# Patient Record
Sex: Male | Born: 1965
Health system: Southern US, Community
[De-identification: ages and names within clinical notes are randomized; demographics above are authoritative.]

## PROBLEM LIST (undated history)

## (undated) DIAGNOSIS — E119 Type 2 diabetes mellitus without complications: Secondary | ICD-10-CM

## (undated) HISTORY — DX: Type 2 diabetes mellitus without complications: E11.9

---

## 2003-12-19 ENCOUNTER — Observation Stay (HOSPITAL_COMMUNITY): Admission: RE | Admit: 2003-12-19 | Discharge: 2003-12-20 | Payer: Self-pay | Admitting: Orthopedic Surgery

## 2007-03-13 ENCOUNTER — Emergency Department (HOSPITAL_COMMUNITY): Admission: EM | Admit: 2007-03-13 | Discharge: 2007-03-13 | Payer: Self-pay | Admitting: Family Medicine

## 2008-10-18 ENCOUNTER — Encounter: Admission: RE | Admit: 2008-10-18 | Discharge: 2008-10-18 | Payer: Self-pay | Admitting: Family Medicine

## 2010-09-05 NOTE — Op Note (Signed)
NAME:  Juan Compton, Juan Compton                           ACCOUNT NO.:  [id number]   MEDICAL RECORD NO.:  [id number]                   PATIENT TYPE:  AMB   LOCATION:  DAY                                  FACILITY:  [facility]   PHYSICIAN:  Ronald A. Gioffre, M.D.             DATE OF BIRTH:  [birth year]   DATE OF PROCEDURE:  12/19/2003  DATE OF DISCHARGE:                                 OPERATIVE REPORT   PREOPERATIVE DIAGNOSES:  Complete rupture of the left Achilles tendon.   POSTOPERATIVE DIAGNOSIS:  Complete rupture of the left Achilles tendon.   OPERATION:  1.  Repair of the Achilles tendon, left ankle.  2.  Reinforcement with a Tissue Mend tendon graft with a 5 x 6 double      thickness.  3.  Application of a short-leg cast.   SURGEON:  Georges Lynch. Darrelyn Hillock, M.D.   ASSISTANT:  Ebbie Ridge. Paitsel, P.A.   PROCEDURE:  Under general anesthesia, with the patient in a prone position,  a routine orthopedic prep and draping of the left lower extremity was  carried out.  Following that, the patient first had 1 g of IV Ancef.  The  incision was made along the lateral border of the Achilles tendon.  Great  care was taken not to injure any of the underlying neurological and vascular  structures.  I identified the Achilles tendon.  The tendon looked like the  end of a mop the way it was torn.  I irrigated the area out and sutured the  tendon end-to-end, but we still had the defect that we approximated with a  double-thickness Tissue Mend tendon graft.  Following the subcu was closed  with 0 Vicryl, the skin with metal staples.  The patient's foot was placed  in plantar flexion and he was casted with a short-leg cast.  Note, with this  foot plantar flexed and me flexing and extending his knee, there was no  tension on his graft site or his tendon site.  He will be placed on Coumadin  and heparin protocol and released tomorrow after he learns to walk with his  crutches.            Ronald A. Darrelyn Hillock, M.D.    RAG/MEDQ  D:  12/19/2003  T:  12/19/2003  Job:  [id number]

## 2011-09-15 ENCOUNTER — Emergency Department (HOSPITAL_COMMUNITY)
Admission: EM | Admit: 2011-09-15 | Discharge: 2011-09-15 | Payer: 59 | Source: Home / Self Care | Attending: Family Medicine | Admitting: Family Medicine

## 2011-09-15 NOTE — ED Notes (Signed)
Pt called in lobby x2.  

## 2011-09-15 NOTE — ED Notes (Signed)
Pt called x 3 no answer 

## 2011-09-15 NOTE — ED Notes (Signed)
Pt called in lobby x 1 

## 2013-02-03 ENCOUNTER — Telehealth: Payer: Self-pay | Admitting: *Deleted

## 2013-02-03 NOTE — Telephone Encounter (Signed)
rx denied, patient needs office visit

## 2018-10-18 ENCOUNTER — Encounter: Payer: Self-pay | Admitting: Podiatry

## 2018-10-18 ENCOUNTER — Other Ambulatory Visit: Payer: Self-pay

## 2018-10-18 ENCOUNTER — Ambulatory Visit (INDEPENDENT_AMBULATORY_CARE_PROVIDER_SITE_OTHER): Payer: 59

## 2018-10-18 ENCOUNTER — Ambulatory Visit (INDEPENDENT_AMBULATORY_CARE_PROVIDER_SITE_OTHER): Payer: 59 | Admitting: Podiatry

## 2018-10-18 VITALS — BP 137/84 | HR 83 | Temp 98.3°F | Resp 16

## 2018-10-18 DIAGNOSIS — M779 Enthesopathy, unspecified: Secondary | ICD-10-CM | POA: Diagnosis not present

## 2018-10-18 DIAGNOSIS — M778 Other enthesopathies, not elsewhere classified: Secondary | ICD-10-CM

## 2018-10-18 DIAGNOSIS — S92811A Other fracture of right foot, initial encounter for closed fracture: Secondary | ICD-10-CM | POA: Diagnosis not present

## 2018-10-18 DIAGNOSIS — L603 Nail dystrophy: Secondary | ICD-10-CM

## 2018-10-18 NOTE — Progress Notes (Signed)
  Subjective:  Patient ID: Juan Compton, male    DOB: 07/20/1965,  MRN: [id number] HPI Chief Complaint  Patient presents with  . Foot Pain    Sub 1st MPJ right - aching, sudden pain that started Saturday, no injury, iced and advil  . Nail Problem    Toenails - thick and discolored x years  . New Patient (Initial Visit)    53 y.o. male presents with the above complaint.   ROS: Denies fever chills nausea vomiting muscle aches pains calf pain back pain chest pain shortness of breath.  No past medical history on file.   Current Outpatient Medications:  .  GLIPIZIDE PO, Take by mouth., Disp: , Rfl:  .  metFORMIN (GLUCOPHAGE) 500 MG tablet, Take by mouth 2 (two) times daily with a meal., Disp: , Rfl:  .  Insulin Glargine (BASAGLAR KWIKPEN) 100 UNIT/ML SOPN, INJECT 50 UNITS SUBCUTANEOUSLY ONCE DAILY, Disp: , Rfl:  .  MM PEN NEEDLES 31G X 8 MM MISC, AS DIRECTED ONCE DAILY, Disp: , Rfl:  .  sildenafil (REVATIO) 20 MG tablet, TAKE 1 TO 5 TABLETS BY MOUTH ONCE DAILY AS NEEDED FOR 18 DAYS, Disp: , Rfl:  .  simvastatin (ZOCOR) 40 MG tablet, TAKE 1 TABLET BY MOUTH EVERY OTHER DAY IN THE EVENING, Disp: , Rfl:   No Known Allergies Review of Systems Objective:   Vitals:   10/18/18 1552  BP: 137/84  Pulse: 83  Resp: 16  Temp: 98.3 F (36.8 C)    General: Well developed, nourished, in no acute distress, alert and oriented x3   Dermatological: Skin is warm, dry and supple bilateral. Nails x 10 are well maintained; remaining integument appears unremarkable at this time. There are no open sores, no preulcerative lesions, no rash or signs of infection present.  Hallux toenails are thick yellow dystrophic possibly mycotic.  Vascular: Dorsalis Pedis artery and Posterior Tibial artery pedal pulses are 2/4 bilateral with immedate capillary fill time. Pedal hair growth present. No varicosities and no lower extremity edema present bilateral.   Neruologic: Grossly intact via light touch bilateral.  Vibratory intact via tuning fork bilateral. Protective threshold with Semmes Wienstein monofilament intact to all pedal sites bilateral. Patellar and Achilles deep tendon reflexes 2+ bilateral. No Babinski or clonus noted bilateral.   Musculoskeletal: No gross boney pedal deformities bilateral. No pain, crepitus, or limitation noted with foot and ankle range of motion bilateral. Muscular strength 5/5 in all groups tested bilateral.  Pain on palpation and attempted range of motion of the first metatarsophalangeal joint and particularly the tibial sesamoid.  Gait: Unassisted, Nonantalgic.    Radiographs:  Radiographs taken today demonstrates what appears to be a fracture of the tibial sesamoid and some osteoarthritic changes of the first metatarsal phalangeal joint.  Assessment & Plan:   Assessment: Osteoarthritis of the first metatarsophalangeal joint fracture tibial sesamoid right.  Possible onychomycosis.  Plan: Placed him in a Darco shoe and meloxicam today I also took samples of the toenails to be sent for pathologic evaluation we will follow-up with him in 1 month     Max T. [place], [place]

## 2018-10-19 ENCOUNTER — Ambulatory Visit: Payer: 59 | Admitting: Podiatry

## 2018-10-19 ENCOUNTER — Telehealth: Payer: Self-pay | Admitting: Podiatry

## 2018-10-19 NOTE — Addendum Note (Signed)
Addended by: Clovis Riley E on: 10/19/2018 12:07 PM   Modules accepted: Orders

## 2018-10-19 NOTE — Telephone Encounter (Signed)
Patient says his medications was not called into the pharmacy from yesterdays appt. An anti inflammatory medication. Also he has a questions about a discontinues medication.

## 2018-10-20 MED ORDER — MELOXICAM 15 MG PO TABS: 15.0000 mg | ORAL_TABLET | Freq: Every day | ORAL | 3 refills | Status: AC

## 2018-10-20 NOTE — Addendum Note (Signed)
Addended by: Rip Harbour on: 10/20/2018 08:24 AM   Modules accepted: Orders

## 2018-10-25 ENCOUNTER — Ambulatory Visit: Payer: 59 | Admitting: Sports Medicine

## 2018-11-17 ENCOUNTER — Ambulatory Visit (INDEPENDENT_AMBULATORY_CARE_PROVIDER_SITE_OTHER): Payer: 59

## 2018-11-17 ENCOUNTER — Encounter: Payer: Self-pay | Admitting: Podiatry

## 2018-11-17 ENCOUNTER — Ambulatory Visit (INDEPENDENT_AMBULATORY_CARE_PROVIDER_SITE_OTHER): Payer: 59 | Admitting: Podiatry

## 2018-11-17 ENCOUNTER — Other Ambulatory Visit: Payer: Self-pay

## 2018-11-17 VITALS — Temp 97.4°F

## 2018-11-17 DIAGNOSIS — S92811D Other fracture of right foot, subsequent encounter for fracture with routine healing: Secondary | ICD-10-CM | POA: Diagnosis not present

## 2018-11-17 NOTE — Progress Notes (Signed)
He presents today for follow-up of his fracture to the tibial sesamoid right foot.  States that is doing much better I am also here for my lab work.  Objective: Vital signs are stable he is alert and oriented x3.  No longer has pain on palpation and range of motion of the tibial sesamoid of the right foot.  Radiographs confirm that appears to be healing fracture.  Pathology report does demonstrate onychomycosis.  Assessment: Onychomycosis well-healing tibial sesamoid.  Plan: Discussed with him in great detail today options regarding the oral medication laser therapy and topical therapy he does not want to do any therapy for the onychomycosis at this point.  And he will follow-up with [place] for the tibial sesamoid whenever he needs [place].

## 2018-12-16 ENCOUNTER — Ambulatory Visit
Admission: RE | Admit: 2018-12-16 | Discharge: 2018-12-16 | Disposition: A | Discharge status: Home / Self Care | Payer: 59 | Source: Ambulatory Visit | Attending: Internal Medicine | Admitting: Internal Medicine

## 2018-12-16 ENCOUNTER — Other Ambulatory Visit: Payer: Self-pay | Admitting: Internal Medicine

## 2018-12-16 DIAGNOSIS — R05 Cough: Secondary | ICD-10-CM

## 2018-12-16 DIAGNOSIS — R053 Chronic cough: Secondary | ICD-10-CM

## 2019-01-16 ENCOUNTER — Other Ambulatory Visit: Payer: Self-pay

## 2019-01-16 ENCOUNTER — Institutional Professional Consult (permissible substitution): Payer: Self-pay | Admitting: Pulmonary Disease

## 2019-01-16 ENCOUNTER — Ambulatory Visit (INDEPENDENT_AMBULATORY_CARE_PROVIDER_SITE_OTHER): Payer: 59 | Admitting: Pulmonary Disease

## 2019-01-16 ENCOUNTER — Encounter: Payer: Self-pay | Admitting: Pulmonary Disease

## 2019-01-16 VITALS — BP 122/76 | HR 96 | Temp 97.4°F | Ht 71.0 in | Wt 267.4 lb

## 2019-01-16 DIAGNOSIS — R053 Chronic cough: Secondary | ICD-10-CM

## 2019-01-16 DIAGNOSIS — R05 Cough: Secondary | ICD-10-CM | POA: Diagnosis not present

## 2019-01-16 DIAGNOSIS — E119 Type 2 diabetes mellitus without complications: Secondary | ICD-10-CM | POA: Insufficient documentation

## 2019-01-16 DIAGNOSIS — E785 Hyperlipidemia, unspecified: Secondary | ICD-10-CM | POA: Insufficient documentation

## 2019-01-16 NOTE — Progress Notes (Signed)
Subjective:   PATIENT ID: Juan Compton GENDER: male DOB: [birth year], MRN: [id number]   HPI  Chief Complaint  Patient presents with  . Consult    persistant cough possible acid reflux-1 year    Reason for Visit: New consult for chronic cough  Juan Compton is a 53 year old male never smoker with DM2 and HTN who presents with chronic cough x 1 year  Seen by Deboraha Sprang on 12/16/18. PCP note reviewed and summarized as follows: Seen for chronic cough since October 2019 Denies allergies. Associated with hoarsness. Previously seen by ENT in 2015 with negative evaluation. Tried on allery allergy and reflux medication and referred to Pulmonary. CXR obtained and reported normal.  He reports a chronic cough for over a year since October 2019. He noticed the cough occurring at night that would wake him up. He describes the cough as unproductive. Denies wheezing. Very rarely he will have shortness of breath. 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 He has hoarseness that will come and go with talking. He reports mild improvement with allergies and reflux. He had intermittent reflux in the past but nothing persistent. No allergy symptoms but tolerating his generic allegra.  I have personally reviewed patient's past medical/family/social history, allergies, current medications.  Past Medical History:  Diagnosis Date  . Diabetes (HCC)      Family History  Problem Relation Age of Onset  . Breast cancer Sister      Social History   Occupational History  . Not on file  Tobacco Use  . Smoking status: Never Smoker  . Smokeless tobacco: Never Used  Substance and Sexual Activity  . Alcohol use: Not Currently    Frequency: Never  . Drug use: Not on file  . Sexual activity: Not on file    Allergies  Allergen Reactions  . Pecan Nut (Diagnostic) Anaphylaxis    All Tree nuts     Outpatient Medications Prior to Visit  Medication Sig  Dispense Refill  . GLIPIZIDE PO Take 5 mg by mouth daily.     . Insulin Glargine (BASAGLAR KWIKPEN) 100 UNIT/ML SOPN INJECT 50 UNITS SUBCUTANEOUSLY ONCE DAILY    . metFORMIN (GLUCOPHAGE-XR) 500 MG 24 hr tablet     . MM PEN NEEDLES 31G X 8 MM MISC AS DIRECTED ONCE DAILY    . sildenafil (REVATIO) 20 MG tablet TAKE 1 TO 5 TABLETS BY MOUTH ONCE DAILY AS NEEDED FOR 18 DAYS    . meloxicam (MOBIC) 15 MG tablet Take 1 tablet (15 mg total) by mouth daily. (Patient not taking: Reported on 01/16/2019) 30 tablet 3  . simvastatin (ZOCOR) 40 MG tablet TAKE 1 TABLET BY MOUTH EVERY OTHER DAY IN THE EVENING     No facility-administered medications prior to visit.     Review of Systems  Constitutional: Negative for chills, diaphoresis, fever, malaise/fatigue and weight loss.  HENT: Negative for congestion, ear pain and sore throat.   Respiratory: Positive for cough. Negative for hemoptysis, sputum production, shortness of breath and wheezing.   Cardiovascular: Negative for chest pain, palpitations and leg swelling.  Gastrointestinal: Negative for abdominal pain, heartburn and nausea.  Genitourinary: Negative for frequency.  Musculoskeletal: Negative for joint pain and myalgias.  Skin: Negative for itching and rash.  Neurological: Negative for dizziness, weakness and headaches.  Endo/Heme/Allergies: Does not bruise/bleed easily.  Psychiatric/Behavioral: Negative for depression. The patient is not nervous/anxious.  Objective:   Vitals:   01/16/19 1124 01/16/19 1126  BP:  122/76  Pulse:  96  Temp: (!) 97.4 F (36.3 C)   TempSrc: Temporal   SpO2:  98%  Weight: 267 lb 6.4 oz (121.3 kg)   Height: 5\' 11"  (1.803 m)    SpO2: 98 % O2 Device: None (Room air)  Physical Exam: General: Well-appearing, no acute distress HENT: [place], AT Eyes: EOMI, no scleral icterus Respiratory: Clear to auscultation bilaterally.  No crackles, wheezing or rales Cardiovascular: RRR, -M/R/G, no JVD GI: BS+, soft,  nontender Extremities:-Edema,-tenderness Neuro: AAO x4, CNII-XII grossly intact Skin: Intact, no rashes or bruising Psych: Normal mood, normal affect  Data Reviewed:  Imaging: CXR 12/16/18 (report only) - Lungs are clear. Heart size and pulmonary vascularity are normal. No adenopathy. No bone lesions.  PFT: None on file   Labs: [facility] 12/16/18 [place] Na/K/Cl/CO2/BUN/Cr 138/4.5/104/26/17/1.18  Imaging, labs and tests noted above have been reviewed independently by me.    Assessment & Plan:   Discussion: 53 year old male never smoker with DM2 and HTN who presents with chronic cough x 1 year. Mild improvement on allergy and reflux medications. Medications reviewed and no ACEI. Will rule out underlying lung issues that may contribute to cough. Discussed voice rest however this is difficult for his profession.  Chronic Cough We will order Pulmonary Function Test prior to your next visit  Please continue to take your reflux and allergy medication as prescribed  Health Maintenance  There is no immunization history on file for this patient. Declined influenza vaccine.   Orders Placed This Encounter  Procedures  . Pulmonary function test    Standing Status:   Future    Standing Expiration Date:   01/16/2020    Order Specific Question:   Where should this test be performed?    Answer:   [facility]    Order Specific Question:   Full PFT: includes the following: basic spirometry, spirometry pre & post bronchodilator, diffusion capacity (DLCO), lung volumes    Answer:   Full PFT  No orders of the defined types were placed in this encounter.   Return after PFTs.  [place], MD [facility] 01/16/2019 4:23 PM  Office Number [phone]

## 2019-01-16 NOTE — Patient Instructions (Signed)
Chronic Cough We will order Pulmonary Function Test prior to your next visit  Please continue to take your reflux and allergy medication as prescribed  For any issues or questions, feel free to contact our office  F/u with me after PFTs

## 2019-01-16 NOTE — Progress Notes (Deleted)
    Subjective:   PATIENT ID: Juan Compton GENDER: male DOB: Oct 27, 1965, MRN: [id number]   HPI  No chief complaint on file.   Reason for Visit: New consult for chronic cough  Referred to Pulmonary clinic by Elenora Gamma. Records*** *** Social History:  Environmental exposures: ***  I have personally reviewed patient's past medical/family/social history, allergies, current medications.***  No past medical history on file.   No family history on file.   Social History   Occupational History  . Not on file  Tobacco Use  . Smoking status: Never Smoker  . Smokeless tobacco: Never Used  Substance and Sexual Activity  . Alcohol use: Not Currently    Frequency: Never  . Drug use: Not on file  . Sexual activity: Not on file    No Known Allergies   Outpatient Medications Prior to Visit  Medication Sig Dispense Refill  . GLIPIZIDE PO Take by mouth.    . Insulin Glargine (BASAGLAR KWIKPEN) 100 UNIT/ML SOPN INJECT 50 UNITS SUBCUTANEOUSLY ONCE DAILY    . meloxicam (MOBIC) 15 MG tablet Take 1 tablet (15 mg total) by mouth daily. 30 tablet 3  . metFORMIN (GLUCOPHAGE-XR) 500 MG 24 hr tablet     . MM PEN NEEDLES 31G X 8 MM MISC AS DIRECTED ONCE DAILY    . sildenafil (REVATIO) 20 MG tablet TAKE 1 TO 5 TABLETS BY MOUTH ONCE DAILY AS NEEDED FOR 18 DAYS    . simvastatin (ZOCOR) 40 MG tablet TAKE 1 TABLET BY MOUTH EVERY OTHER DAY IN THE EVENING     No facility-administered medications prior to visit.     ROS   Objective:  There were no vitals filed for this visit.    Physical Exam: General: Well-appearing, no acute distress HENT: [place], AT, OP clear, MMM Eyes: EOMI, no scleral icterus Lymph: no cervical lymphadenopathy Respiratory: Clear to auscultation bilaterally.  No crackles, wheezing or rales Cardiovascular: RRR, -M/R/G, no JVD GI: BS+, soft, nontender Extremities:-Edema,-tenderness Neuro: AAO x4, CNII-XII grossly intact Skin: Intact, no rashes or bruising Psych:  Normal mood, normal affect  Data Reviewed:  Imaging:  PFT:  Labs:  Imaging, labs and tests noted above have been reviewed independently by me.    Assessment & Plan:   Discussion: ***  Health Maintenance Pneumonia*** Influenza*** CT Lung Screen***  No orders of the defined types were placed in this encounter. No orders of the defined types were placed in this encounter.   No follow-ups on file.  [place], MD [facility] 01/16/2019 7:27 AM  Office Number [phone]

## 2019-01-20 ENCOUNTER — Telehealth: Payer: Self-pay | Admitting: Pulmonary Disease

## 2019-01-20 NOTE — Telephone Encounter (Signed)
Attempted to call patient to schedule covid testing, no answer, left message to call back.

## 2019-01-25 ENCOUNTER — Ambulatory Visit: Payer: 59 | Admitting: Pulmonary Disease

## 2019-01-25 ENCOUNTER — Other Ambulatory Visit: Payer: Self-pay | Admitting: Pulmonary Disease

## 2019-01-25 NOTE — Telephone Encounter (Signed)
Patient scheduled. Nothing further needed at this time.

## 2019-01-25 NOTE — Telephone Encounter (Signed)
Attempted to call patient to schedule covid testing prior to PFT, no answer, left message to call back.

## 2019-01-30 ENCOUNTER — Other Ambulatory Visit: Payer: Self-pay

## 2019-01-30 ENCOUNTER — Other Ambulatory Visit (HOSPITAL_COMMUNITY): Admission: RE | Admit: 2019-01-30 | Payer: 59 | Source: Ambulatory Visit

## 2019-01-30 DIAGNOSIS — Z20822 Contact with and (suspected) exposure to covid-19: Secondary | ICD-10-CM

## 2019-01-31 LAB — NOVEL CORONAVIRUS, NAA: SARS-CoV-2, NAA: NOT DETECTED

## 2019-02-01 ENCOUNTER — Ambulatory Visit: Payer: 59 | Admitting: Pulmonary Disease

## 2019-02-02 ENCOUNTER — Other Ambulatory Visit: Payer: Self-pay

## 2019-02-02 ENCOUNTER — Ambulatory Visit (HOSPITAL_COMMUNITY)
Admission: RE | Admit: 2019-02-02 | Discharge: 2019-02-02 | Disposition: A | Discharge status: Home / Self Care | Payer: 59 | Source: Ambulatory Visit | Attending: Pulmonary Disease | Admitting: Pulmonary Disease

## 2019-02-02 DIAGNOSIS — R053 Chronic cough: Secondary | ICD-10-CM

## 2019-02-02 DIAGNOSIS — R05 Cough: Secondary | ICD-10-CM | POA: Insufficient documentation

## 2019-02-02 LAB — PULMONARY FUNCTION TEST
DL/VA % pred: 127 %
DL/VA: 5.53 ml/min/mmHg/L
DLCO unc % pred: 92 %
DLCO unc: 28.41 ml/min/mmHg
FEF 25-75 Post: 5.36 L/sec
FEF 25-75 Pre: 4.17 L/sec
FEF2575-%Change-Post: 28 %
FEF2575-%Pred-Post: 157 %
FEF2575-%Pred-Pre: 122 %
FEV1-%Change-Post: 6 %
FEV1-%Pred-Post: 88 %
FEV1-%Pred-Pre: 82 %
FEV1-Post: 3.12 L
FEV1-Pre: 2.92 L
FEV1FVC-%Change-Post: 0 %
FEV1FVC-%Pred-Pre: 112 %
FEV6-%Change-Post: 6 %
FEV6-%Pred-Post: 80 %
FEV6-%Pred-Pre: 75 %
FEV6-Post: 3.48 L
FEV6-Pre: 3.27 L
FEV6FVC-%Change-Post: 0 %
FEV6FVC-%Pred-Post: 102 %
FEV6FVC-%Pred-Pre: 102 %
FVC-%Change-Post: 6 %
FVC-%Pred-Post: 78 %
FVC-%Pred-Pre: 73 %
FVC-Post: 3.48 L
FVC-Pre: 3.27 L
Post FEV1/FVC ratio: 90 %
Post FEV6/FVC ratio: 100 %
Pre FEV1/FVC ratio: 89 %
Pre FEV6/FVC Ratio: 100 %
RV % pred: 68 %
RV: 1.51 L
TLC % pred: 69 %
TLC: 5.15 L

## 2019-02-02 MED ORDER — ALBUTEROL SULFATE (2.5 MG/3ML) 0.083% IN NEBU
2.5000 mg | INHALATION_SOLUTION | Freq: Once | RESPIRATORY_TRACT | Status: AC
  Administered 2019-02-02: 2.5 mg via RESPIRATORY_TRACT

## 2019-02-05 NOTE — Progress Notes (Signed)
Subjective:   PATIENT ID: Juan Compton GENDER: male DOB: March 20, 1966, MRN: [id number]   HPI  Chief Complaint  Patient presents with  . Follow-up    PFT   Reason for Visit: Follow-up for chronic cough  Mr. Juan Compton is a 53 year old male never smoker with past medical history of DM2 and HTN who presents for follow-up for chronic cough x 1 year.  He reports a chronic cough for over a year since October 2019. He noticed the cough occurring at night that would wake him up. He describes the cough as unproductive. Denies wheezing. Very rarely he will have shortness of breath. 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 He has hoarseness that will come and go with talking. He reports mild improvement with allergies and reflux. He had intermittent reflux in the past but nothing persistent. No allergy symptoms but tolerating his generic allegra.  Interval He reports unproductive cough is unchanged since our last visit. He doesn't cough during the services. He coughing occurs early in the mornings and at night. He has not been able to rest his voice due to involvement as a Company secretary and a radio personality. He will have associated hoarseness with talking and coughing. He began talking OTC antireflux medications this week with mild improvement.  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   Outpatient Medications Prior to Visit  Medication Sig Dispense Refill  . GLIPIZIDE PO Take 5 mg by mouth daily.     . Insulin Glargine (BASAGLAR KWIKPEN) 100 UNIT/ML SOPN INJECT 50 UNITS SUBCUTANEOUSLY ONCE DAILY    . meloxicam (MOBIC) 15 MG tablet Take 1 tablet (15 mg total) by mouth daily. 30 tablet 3  . metFORMIN (GLUCOPHAGE-XR) 500 MG 24 hr  tablet     . MM PEN NEEDLES 31G X 8 MM MISC AS DIRECTED ONCE DAILY    . sildenafil (REVATIO) 20 MG tablet TAKE 1 TO 5 TABLETS BY MOUTH ONCE DAILY AS NEEDED FOR 18 DAYS    . simvastatin (ZOCOR) 40 MG tablet TAKE 1 TABLET BY MOUTH EVERY OTHER DAY IN THE EVENING     No facility-administered medications prior to visit.     Review of Systems  Constitutional: Negative for chills, diaphoresis, fever, malaise/fatigue and weight loss.  HENT: Negative for congestion, ear pain and sore throat.   Respiratory: Positive for cough. Negative for hemoptysis, sputum production, shortness of breath and wheezing.   Cardiovascular: Negative for chest pain, palpitations and leg swelling.  Gastrointestinal: Negative for abdominal pain, heartburn and nausea.  Genitourinary: Negative for frequency.  Musculoskeletal: Negative for joint pain and myalgias.  Skin: Negative for itching and rash.  Neurological: Negative for dizziness, weakness and headaches.  Endo/Heme/Allergies: Does not bruise/bleed easily.  Psychiatric/Behavioral: Negative for depression. The patient is not nervous/anxious.      Objective:   Vitals:   02/06/19 0929  BP: 124/70  Pulse: 98  Temp: (!) 97 F (36.1 C)  TempSrc: Temporal  SpO2: 100%  Weight: 268 lb 12.8 oz (121.9 kg)  Height: 6' (1.829 m)   SpO2: 100 % O2 Device: None (Room air)  Physical Exam: General: Well-appearing, no acute distress HENT: [place], AT, OP moist and pink, Mallampati IV Eyes: EOMI, no scleral icterus Respiratory:  Clear to auscultation bilaterally.  No crackles, wheezing or rales Cardiovascular: RRR, -M/R/G, no JVD GI: BS+, soft, nontender Extremities:-Edema,-tenderness Neuro: AAO x4, CNII-XII grossly intact Skin: Intact, no rashes or bruising Psych: Normal mood, normal affect  Data Reviewed:  Imaging: CXR 12/16/18 (report only) - Lungs are clear. Heart size and pulmonary vascularity are normal. No adenopathy. No bone lesions.  PFT: 02/02/19 FVC  3.48 (78%) FEV1 3.12 (88%) Ratio 89  TLC 69% DLCO 92% Interpretation: Mild restrictive defect with normal gas exchange. Reduced ERV  Labs: BMP 12/16/18 [facility] Na/K/Cl/CO2/BUN/Cr 138/4.5/104/26/17/1.18  Imaging, labs and test noted above have been reviewed independently by me.    Assessment & Plan:   Discussion: 53 year old male never smoker with DM2 and HTN who presents for follow-up for chronic cough x 1 year. Prior ENT evaluation negative. He reports cough is unchanged.  Began taking antireflux medication with some improvement. We reviewed PFTs which demonstrated no obstructive defect however did show mild restrictive defect with normal gas exchange. I suspect his restriction may be related to chest wall compliance related to body habitus in setting of reduced ERV. In addition to discussing regular aerobic exercise, we will also obtain CT Chest to evaluate other causes of restriction.  Chronic cough  Tests ordered:  CT Chest without contrast to evaluate your lungs  Medications:  START omeprazole 20 mg twice a day for 6 weeks.  After six weeks, take omeprazole for 20 mg daily  Follow-up with me after CT chest is completed  Health Maintenance  There is no immunization history on file for this patient.  Declined influenza vaccine.   Orders Placed This Encounter  Procedures  . CT Chest Wo Contrast    [facility] [phone]/[phone] Pt diabetic / no labs req [employer]    Standing Status:   Future    Standing Expiration Date:   04/07/2020    Order Specific Question:   ** REASON FOR EXAM (FREE TEXT)    Answer:   Restrictive lung disease    Order Specific Question:   Preferred imaging location?    Answer:   [place] CT - [facility]    Order Specific Question:   Radiology Contrast Protocol - do NOT remove file path    Answer:   [email or web address]   Meds ordered this encounter  Medications  . omeprazole (PRILOSEC) 20 MG capsule    Sig: Take 1 capsule (20 mg  total) by mouth 2 (two) times daily before a meal.    Dispense:  30 capsule    Refill:  1    Return after CT scan.  Dekari Bures Mechele Collin, MD [facility] 02/06/2019 10:25 AM  Office Number [phone]

## 2019-02-06 ENCOUNTER — Other Ambulatory Visit: Payer: Self-pay

## 2019-02-06 ENCOUNTER — Ambulatory Visit (INDEPENDENT_AMBULATORY_CARE_PROVIDER_SITE_OTHER): Payer: 59 | Admitting: Pulmonary Disease

## 2019-02-06 ENCOUNTER — Encounter: Payer: Self-pay | Admitting: Pulmonary Disease

## 2019-02-06 VITALS — BP 124/70 | HR 98 | Temp 97.0°F | Ht 72.0 in | Wt 268.8 lb

## 2019-02-06 DIAGNOSIS — R05 Cough: Secondary | ICD-10-CM | POA: Diagnosis not present

## 2019-02-06 DIAGNOSIS — R053 Chronic cough: Secondary | ICD-10-CM

## 2019-02-06 DIAGNOSIS — J984 Other disorders of lung: Secondary | ICD-10-CM

## 2019-02-06 MED ORDER — OMEPRAZOLE 20 MG PO CPDR: 20.0000 mg | DELAYED_RELEASE_CAPSULE | Freq: Two times a day (BID) | ORAL | 1 refills | Status: AC

## 2019-02-06 NOTE — Patient Instructions (Signed)
Chronic cough  Tests ordered:  CT Chest without contrast to evaluate your lungs  Medications:  START omeprazole 20 mg twice a day for 6 weeks.  After six weeks, take omeprazole for 20 mg daily  Follow-up with me after CT chest is completed

## 2019-02-09 ENCOUNTER — Encounter: Payer: Self-pay | Admitting: Pulmonary Disease

## 2019-02-09 DIAGNOSIS — J984 Other disorders of lung: Secondary | ICD-10-CM | POA: Insufficient documentation

## 2019-02-15 ENCOUNTER — Inpatient Hospital Stay: Admission: RE | Admit: 2019-02-15 | Payer: 59 | Source: Ambulatory Visit

## 2019-03-06 ENCOUNTER — Other Ambulatory Visit: Payer: 59

## 2019-08-18 ENCOUNTER — Other Ambulatory Visit: Payer: Self-pay | Admitting: Internal Medicine

## 2019-08-18 ENCOUNTER — Ambulatory Visit
Admission: RE | Admit: 2019-08-18 | Discharge: 2019-08-18 | Disposition: A | Discharge status: Home / Self Care | Payer: 59 | Source: Ambulatory Visit | Attending: Internal Medicine | Admitting: Internal Medicine

## 2019-08-18 DIAGNOSIS — M25561 Pain in right knee: Secondary | ICD-10-CM

## 2020-07-30 IMAGING — CR CHEST - 2 VIEW
3 series · 3 of 3 positions shown · non-contrast
Comparison: None.

CLINICAL DATA: Chronic cough

EXAM:
CHEST - 2 VIEW

[w chest pa (1 of 2)]
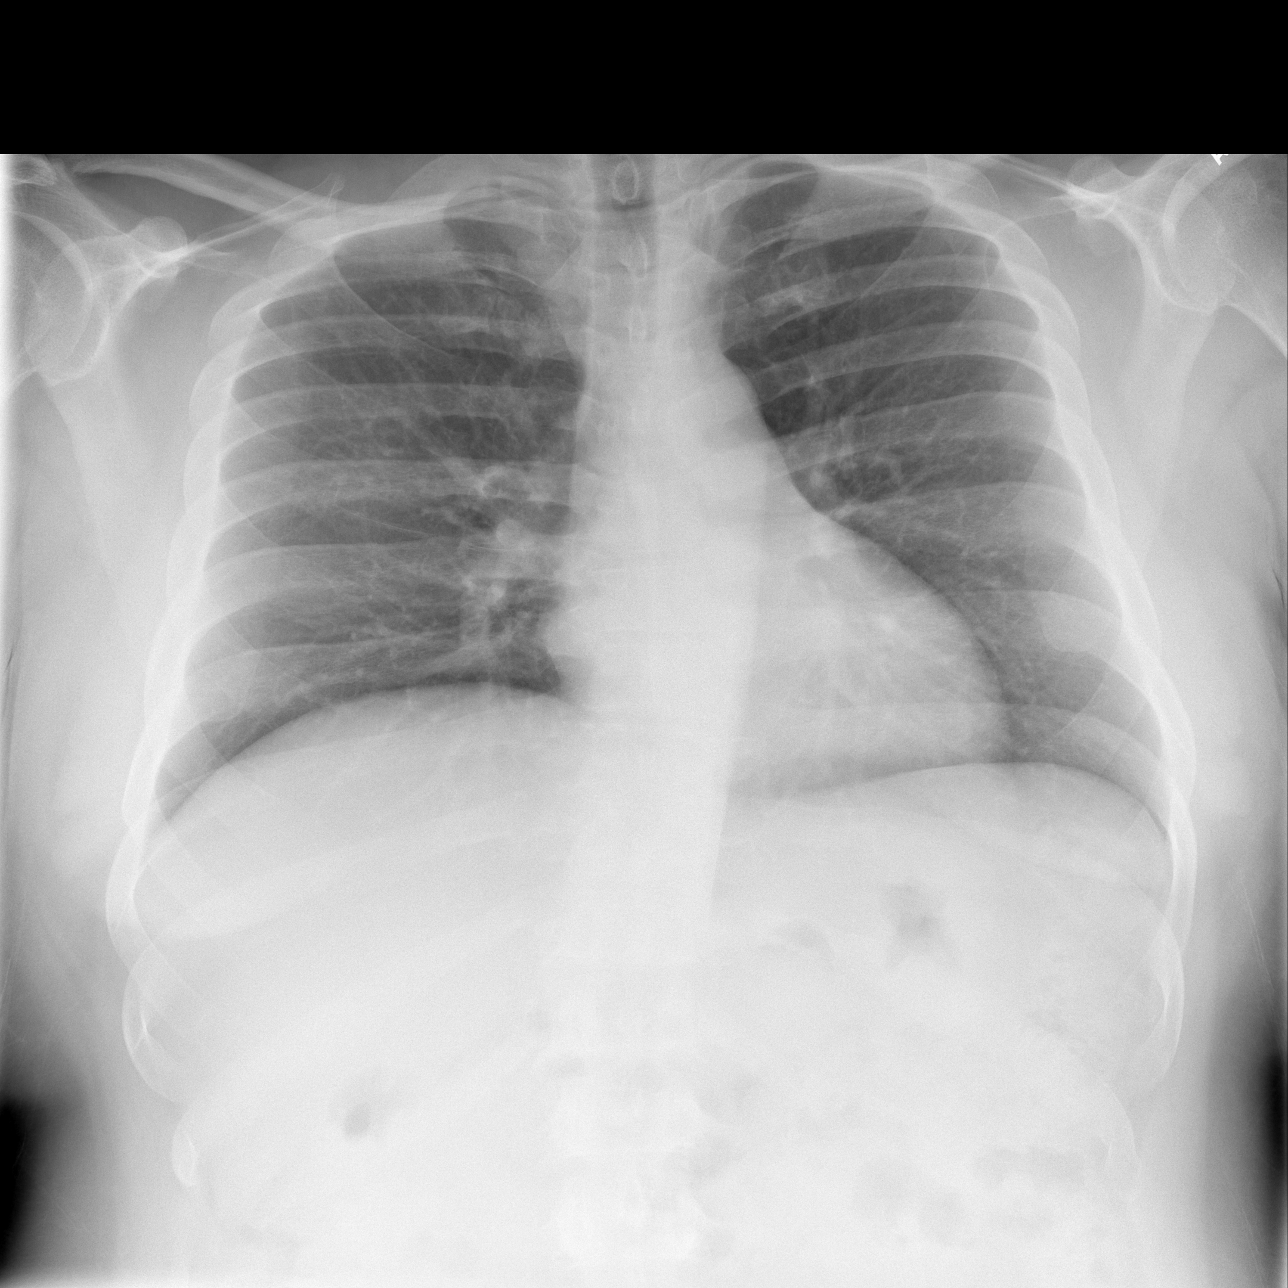

[w chest pa (2 of 2)]
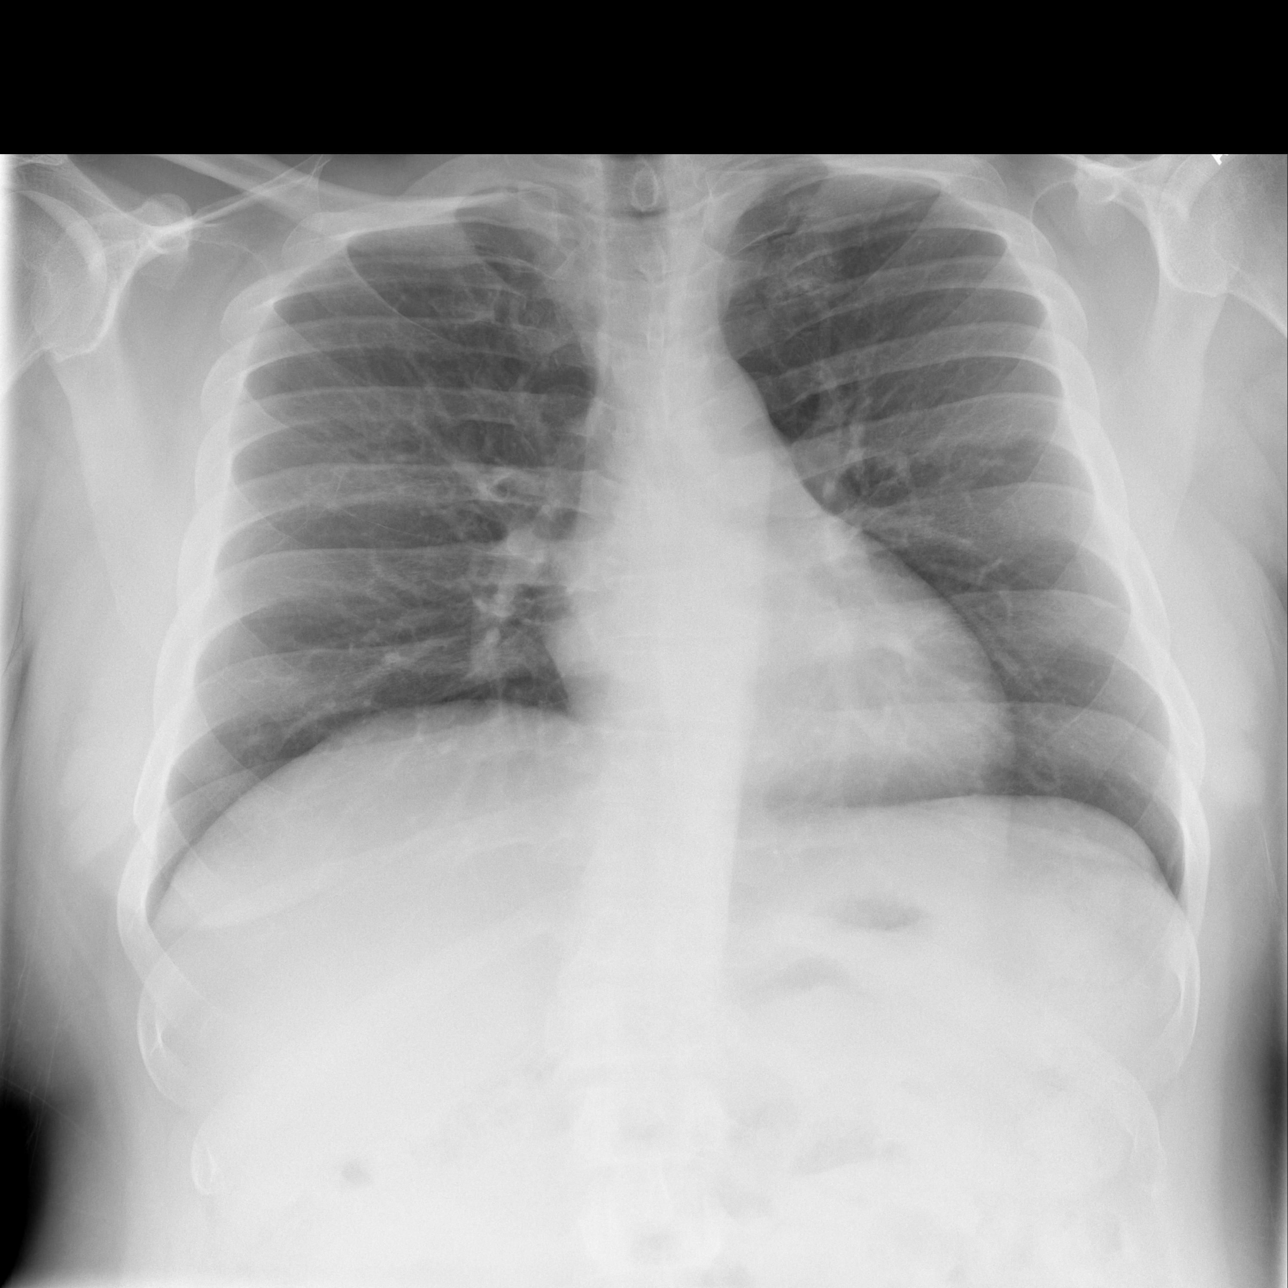

[w chest lat *]
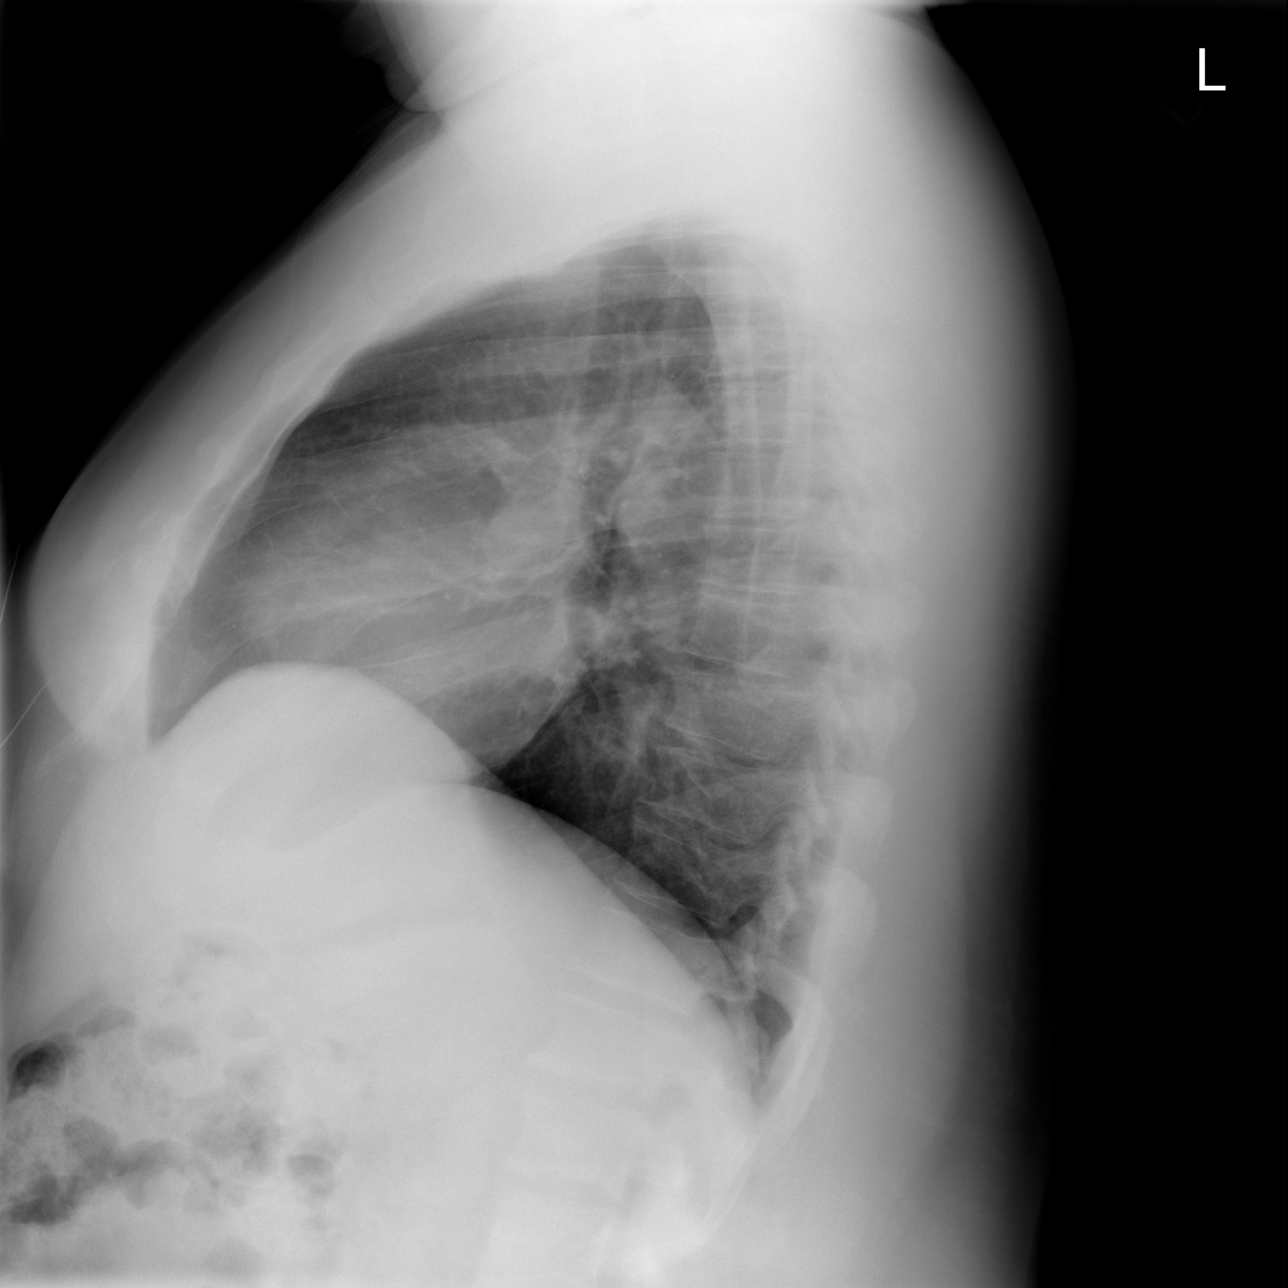

[3 of 3 positions shown; findings below may reference images not displayed]

FINDINGS: Lungs are clear. Heart size and pulmonary vascularity are normal. No
adenopathy. No bone lesions.
IMPRESSION: No edema or consolidation.

## 2023-10-20 ENCOUNTER — Ambulatory Visit: Admitting: Podiatry

## 2023-10-20 ENCOUNTER — Encounter: Payer: Self-pay | Admitting: Podiatry

## 2023-10-20 VITALS — Ht 72.0 in | Wt 268.0 lb

## 2023-10-20 DIAGNOSIS — B351 Tinea unguium: Secondary | ICD-10-CM

## 2023-10-20 DIAGNOSIS — E1140 Type 2 diabetes mellitus with diabetic neuropathy, unspecified: Secondary | ICD-10-CM | POA: Diagnosis not present

## 2023-10-20 DIAGNOSIS — E1149 Type 2 diabetes mellitus with other diabetic neurological complication: Secondary | ICD-10-CM | POA: Diagnosis not present

## 2023-10-20 NOTE — Progress Notes (Signed)
 Subjective:   Patient ID: Juan Compton, male   DOB: 58 y.o.   MRN: [id number]   HPI Patient presents stating concerned about hallux nail discoloration fifth nail discoloration states it has been present for a fairly long time not currently tender and admits that his A1c has not been under good control but he is trying to work on it now   ROS      Objective:  Physical Exam  Neurovascular status intact muscle strength found to be adequate range of motion adequate with patient noted to have good digital perfusion well oriented x 3 and is found to have thickness and discoloration hallux nails bilateral and fifth nails bilateral     Assessment:  Appears to be more of a trauma issue versus a fungal issue and I found that the nailbeds themselves appear to be traumatized with pressure     Plan:  H&P reviewed I discussed the etiology of this condition and the probability for trauma and did not recommend currently that he undergo removal or consider oral medicines topical medicines or laser.  The way they are now they should not cause him problems and I tried to educate him on this and he also complains of moderate neuropathic symptomatology but not burning and shooting and I discussed with him the importance of getting his A1c into a better range
